# Patient Record
Sex: Male | Born: 1935 | Race: Black or African American | Hispanic: No | Marital: Married | Smoking: Former smoker
Health system: Southern US, Community
[De-identification: ages and names within clinical notes are randomized; demographics above are authoritative.]

## PROBLEM LIST (undated history)

## (undated) DIAGNOSIS — G4700 Insomnia, unspecified: Secondary | ICD-10-CM

## (undated) DIAGNOSIS — F0280 Dementia in other diseases classified elsewhere without behavioral disturbance: Secondary | ICD-10-CM

## (undated) DIAGNOSIS — D497 Neoplasm of unspecified behavior of endocrine glands and other parts of nervous system: Secondary | ICD-10-CM

## (undated) DIAGNOSIS — L989 Disorder of the skin and subcutaneous tissue, unspecified: Secondary | ICD-10-CM

## (undated) DIAGNOSIS — H9190 Unspecified hearing loss, unspecified ear: Secondary | ICD-10-CM

## (undated) DIAGNOSIS — E278 Other specified disorders of adrenal gland: Secondary | ICD-10-CM

## (undated) DIAGNOSIS — Z973 Presence of spectacles and contact lenses: Secondary | ICD-10-CM

## (undated) DIAGNOSIS — D649 Anemia, unspecified: Secondary | ICD-10-CM

## (undated) DIAGNOSIS — F419 Anxiety disorder, unspecified: Secondary | ICD-10-CM

## (undated) DIAGNOSIS — Z972 Presence of dental prosthetic device (complete) (partial): Secondary | ICD-10-CM

## (undated) HISTORY — PX: TRANSURETHRAL RESECTION OF PROSTATE: SHX73

---

## 1996-02-20 DIAGNOSIS — N400 Enlarged prostate without lower urinary tract symptoms: Secondary | ICD-10-CM

## 1996-02-20 DIAGNOSIS — C61 Malignant neoplasm of prostate: Secondary | ICD-10-CM

## 1996-02-20 HISTORY — DX: Benign prostatic hyperplasia without lower urinary tract symptoms: N40.0

## 1996-02-20 HISTORY — DX: Malignant neoplasm of prostate: C61

## 2004-06-26 ENCOUNTER — Emergency Department (HOSPITAL_COMMUNITY): Admission: EM | Admit: 2004-06-26 | Discharge: 2004-06-26 | Payer: Self-pay | Admitting: Emergency Medicine

## 2004-07-05 ENCOUNTER — Inpatient Hospital Stay (HOSPITAL_COMMUNITY): Admission: RE | Admit: 2004-07-05 | Discharge: 2004-07-07 | Payer: Self-pay | Admitting: Urology

## 2010-04-23 ENCOUNTER — Inpatient Hospital Stay (HOSPITAL_COMMUNITY)
Admission: EM | Admit: 2010-04-23 | Discharge: 2010-04-26 | DRG: 726 | Disposition: A | Discharge status: Home / Self Care | Payer: Medicare Other | Attending: Urology | Admitting: Urology

## 2010-04-23 DIAGNOSIS — N138 Other obstructive and reflux uropathy: Principal | ICD-10-CM | POA: Diagnosis present

## 2010-04-23 DIAGNOSIS — R310 Gross hematuria: Secondary | ICD-10-CM | POA: Diagnosis present

## 2010-04-23 DIAGNOSIS — N390 Urinary tract infection, site not specified: Secondary | ICD-10-CM | POA: Diagnosis present

## 2010-04-23 DIAGNOSIS — N3289 Other specified disorders of bladder: Secondary | ICD-10-CM | POA: Diagnosis present

## 2010-04-23 DIAGNOSIS — N401 Enlarged prostate with lower urinary tract symptoms: Principal | ICD-10-CM | POA: Diagnosis present

## 2010-04-23 DIAGNOSIS — N400 Enlarged prostate without lower urinary tract symptoms: Secondary | ICD-10-CM | POA: Diagnosis present

## 2010-04-23 DIAGNOSIS — R339 Retention of urine, unspecified: Secondary | ICD-10-CM | POA: Diagnosis present

## 2010-04-23 LAB — CBC
HCT: 40.8 % (ref 39.0–52.0)
Hemoglobin: 13.3 g/dL (ref 13.0–17.0)
MCH: 29.8 pg (ref 26.0–34.0)
MCHC: 32.6 g/dL (ref 30.0–36.0)
RBC: 4.46 MIL/uL (ref 4.22–5.81)
RDW: 13.3 % (ref 11.5–15.5)

## 2010-04-23 LAB — BASIC METABOLIC PANEL
CO2: 27 mEq/L (ref 19–32)
Calcium: 9.3 mg/dL (ref 8.4–10.5)
Creatinine, Ser: 0.87 mg/dL (ref 0.4–1.5)
Glucose, Bld: 104 mg/dL — ABNORMAL HIGH (ref 70–99)
Potassium: 3.8 mEq/L (ref 3.5–5.1)

## 2010-04-23 LAB — URINALYSIS, ROUTINE W REFLEX MICROSCOPIC
Glucose, UA: NEGATIVE mg/dL
Nitrite: POSITIVE — AB
Specific Gravity, Urine: 1.024 (ref 1.005–1.030)
Urobilinogen, UA: 1 mg/dL (ref 0.0–1.0)
pH: 5 (ref 5.0–8.0)

## 2010-04-23 LAB — URINE MICROSCOPIC-ADD ON

## 2010-04-23 LAB — DIFFERENTIAL
Lymphs Abs: 1.9 10*3/uL (ref 0.7–4.0)
Monocytes Absolute: 0.4 10*3/uL (ref 0.1–1.0)

## 2010-04-25 ENCOUNTER — Inpatient Hospital Stay (HOSPITAL_COMMUNITY): Payer: Medicare Other

## 2010-04-25 LAB — URINE CULTURE: Culture  Setup Time: 201203042347

## 2010-04-25 LAB — HEMOGLOBIN AND HEMATOCRIT, BLOOD: HCT: 37.5 % — ABNORMAL LOW (ref 39.0–52.0)

## 2010-04-25 MED ORDER — IOHEXOL 300 MG/ML  SOLN
125.0000 mL | Freq: Once | INTRAMUSCULAR | Status: AC | PRN
  Administered 2010-04-25: 125 mL via INTRAVENOUS

## 2010-04-28 NOTE — Discharge Summary (Signed)
Adrian Valentine, Adrian Valentine                   ACCOUNT NO.:  [id number]  MEDICAL RECORD NO.:  [id number]           PATIENT TYPE:  I  LOCATION:  1305                         FACILITY:  [facility]  PHYSICIAN:  Excell Seltzer. Annabell Howells, M.D.    DATE OF BIRTH:  01/15/1934  DATE OF ADMISSION:  04/23/2010 DATE OF DISCHARGE:                              DISCHARGE SUMMARY   BRIEF HISTORY:  Briefly, [name] is a 77 year old African-American male with 3 prior TURP, who had the sudden onset of gross hematuria on March 04 with difficulty emptying his bladder.  He was seen in the ER and placed on small catheters, did not successfully correct the problem. The patient had no irritative voiding symptoms or significant voiding difficulty prior to the onset of hematuria.  For additional details of history and physical, please see the H&P.  MEDICAL HISTORY:  Significant for BPH and bladder outlet obstruction for 3 prior TURP.  MEDICATIONS:  His only medications were multivitamin, vitamin C and cod liver oil but he did take some NyQuil in the days preceding of it.  ALLERGIES:  He has no drug allergies.  LABORATORY DATA:  Accessory clinical information, admission urine had large bilirubin, large blood, greater than 300 mg/dL protein was nitrite positive, large leukocyte esterase.  Urine micro had 3 to 6 white cells and too numerous to count red cells and few bacteria.  Urine culture was negative.  Initial CBC had a hemoglobin of 13.3.  White count of 9. BMET was normal with exception of a glucose of 104 on a nonfasting lab. Repeat H&H on March 6 had a hemoglobin of 12.  CT scan of the abdomen and pelvis with and without contrast on April 25, 2010 showed a large homogeneous prostate, bilateral benign appearing adrenal adenoma, and no other significant abnormalities.  HOSPITAL COURSE:  On the day of admission, a large 3-way Foley catheter was placed and the bladder was irrigated.  He was started on Bactrim DS 1 p.o. b.i.d.,  pending urine culture.  After admission, the bleeding gradually cleared but on March 5, he was having leaking around the catheter and was found to have a large amount of residual clots that require irrigation out by me.  Once the irrigation had been completed, it was felt to not be actively bleeding.  I went ahead and got the CT to rule out an upper tract cause of his bleeding.  This was noted above and was unremarkable with exception of a large prostate.  His Foley catheter was removed on the morning of March 6 prior to the CT scan but he was unable to void throughout the day and bladder scan revealed residual of over 500 mL.  He did void a small amount but Foley catheter was placed with significant return, so it was left to straight drainage.  On March 7, he was doing well.  His urine was clear.  His hemoglobin was 12.  He was felt to be ready for discharge home.  FINAL DIAGNOSES:  Gross hematuria secondary to benign prostatic hypertrophy with clot retention.  The bleeding resolved but the retention  did not.  There were no complications.  DISCHARGE INSTRUCTIONS:  He will be discharged home on tamsulosin and finasteride with instructions to avoid nonsteroidal anti-inflammatories or aspirin and decongestants.  He will follow with me on March 16 in my Regional Office for cystoscopy and assessment for additional therapy. He may need a repeat TURP or possibly an open prostatectomy.  DISPOSITION:  Home.  PROGNOSIS:  Good.  CONDITION ON DISCHARGE:  Improved.     Excell Seltzer. Annabell Howells, M.D.     JJW/MEDQ  D:  04/26/2010  T:  04/26/2010  Job:  [id number]  Electronically Signed by Bjorn Pippin M.D. on 04/27/2010 12:58:51 PM

## 2010-05-05 ENCOUNTER — Ambulatory Visit (INDEPENDENT_AMBULATORY_CARE_PROVIDER_SITE_OTHER): Payer: Medicare Other | Admitting: Urology

## 2010-05-05 DIAGNOSIS — N401 Enlarged prostate with lower urinary tract symptoms: Secondary | ICD-10-CM

## 2010-05-05 DIAGNOSIS — R338 Other retention of urine: Secondary | ICD-10-CM

## 2011-11-01 IMAGING — CT CT ABD-PEL WO/W CM
2 of 10 series · 10 of 46 positions shown, 16 images · IV contrast (agent unspecified)
Comparison: None.

CLINICAL DATA: Gross hematuria.  Urinary retention.

CT ABDOMEN AND PELVIS WITHOUT AND WITH CONTRAST
TECHNIQUE: Multidetector CT imaging of the abdomen and pelvis was
performed without contrast material in one or both body regions,
followed by contrast material(s) and further sections in one or
both body regions.
Contrast: 125 ml Fmnipaque-XAA

[Series 8: post · axial · 0.74mm/px · z∈[-494,-154]mm · 8 of 88 slices shown, 13 images]
[im 10/88  soft-tissue]
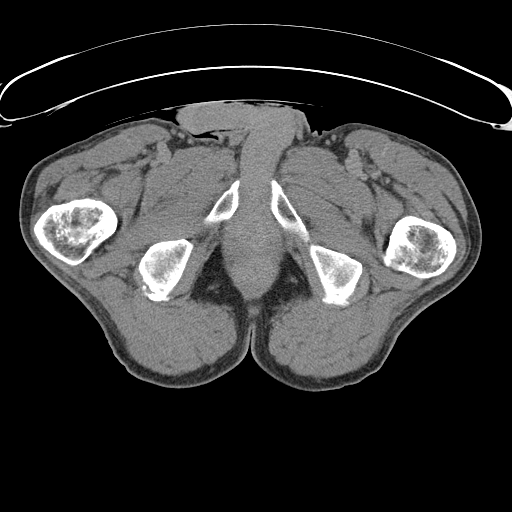
[im 10/88  bone]
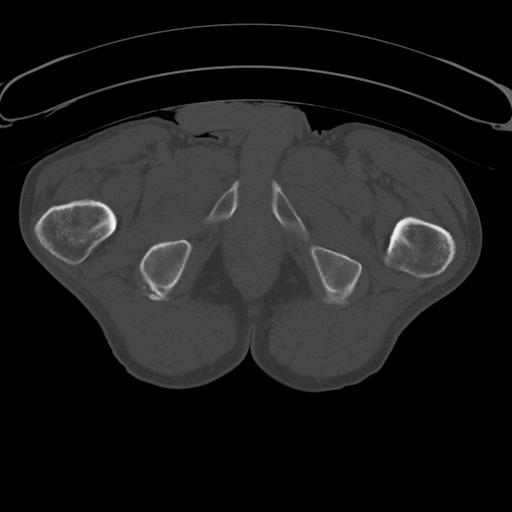
[im 20/88  soft-tissue]
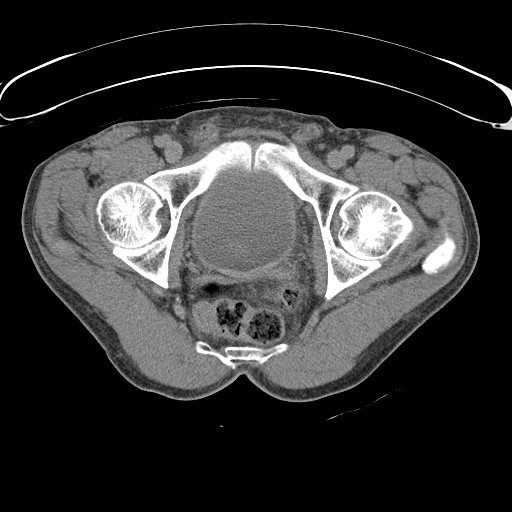
[im 30/88  soft-tissue]
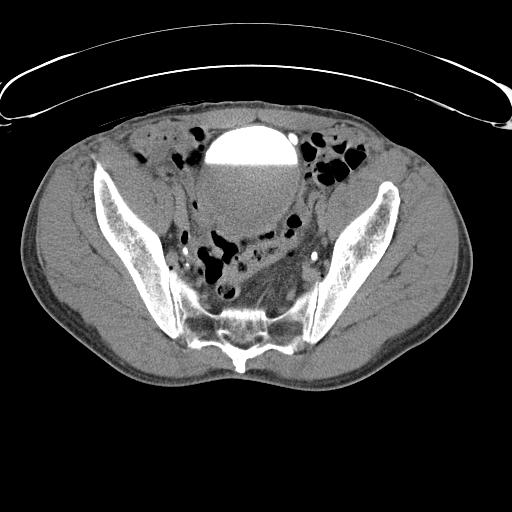
[im 39/88  soft-tissue]
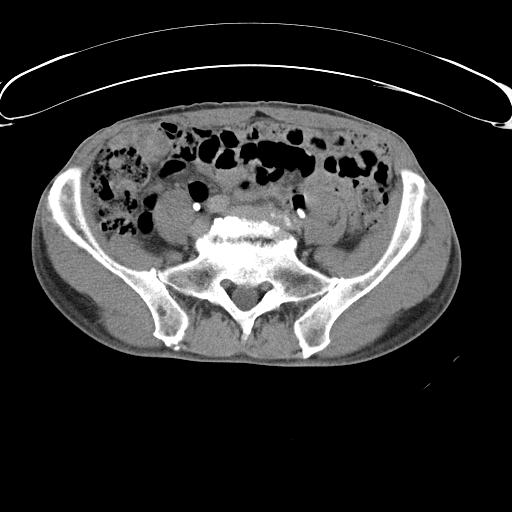
[im 49/88  soft-tissue]
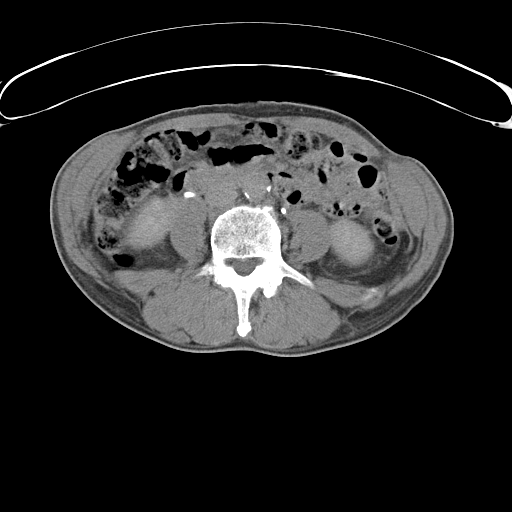
[im 49/88  lung]
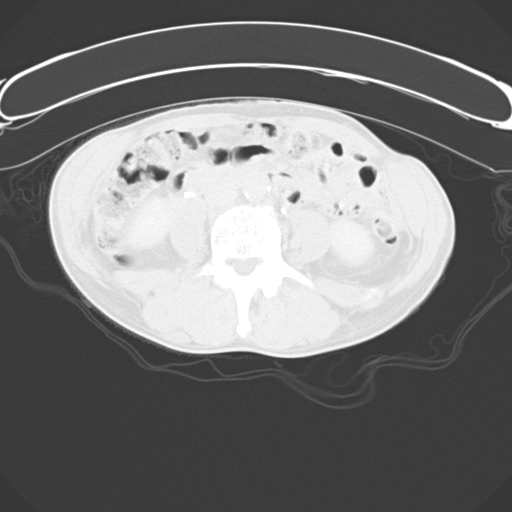
[im 59/88  soft-tissue]
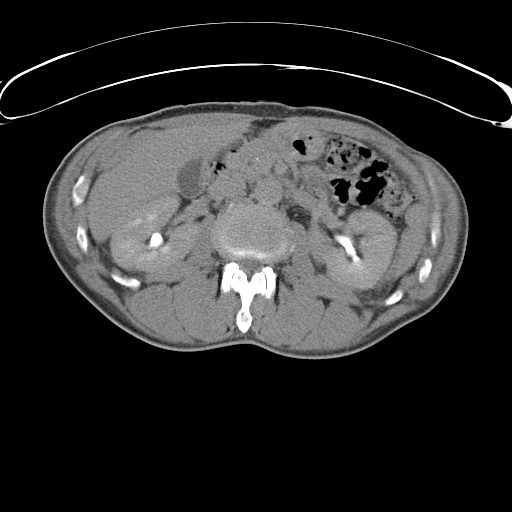
[im 59/88  lung]
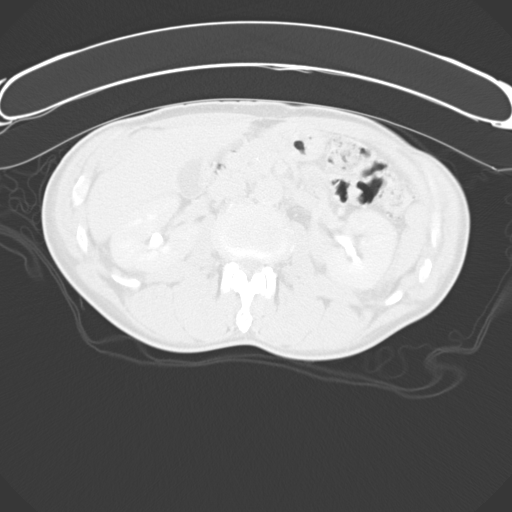
[im 68/88  soft-tissue]
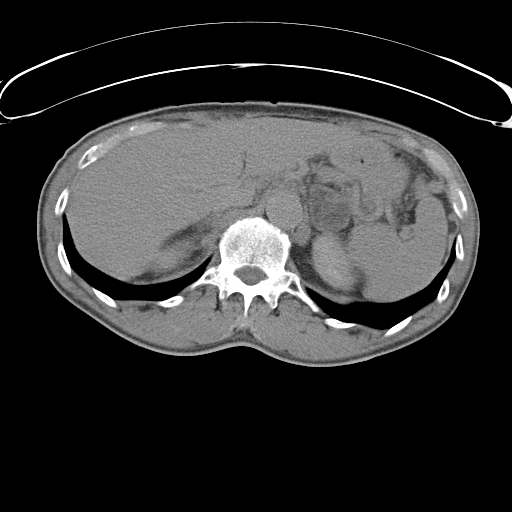
[im 68/88  lung]
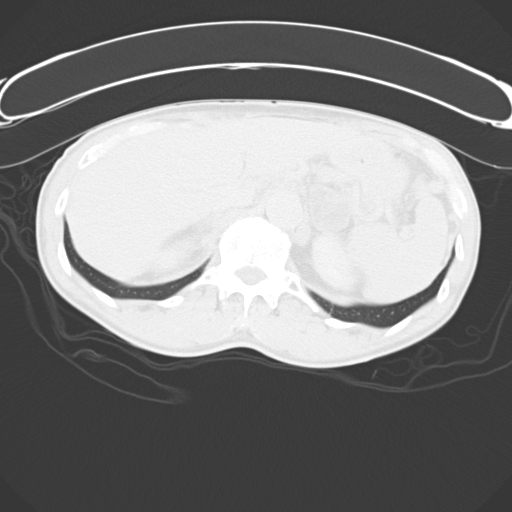
[im 78/88  soft-tissue]
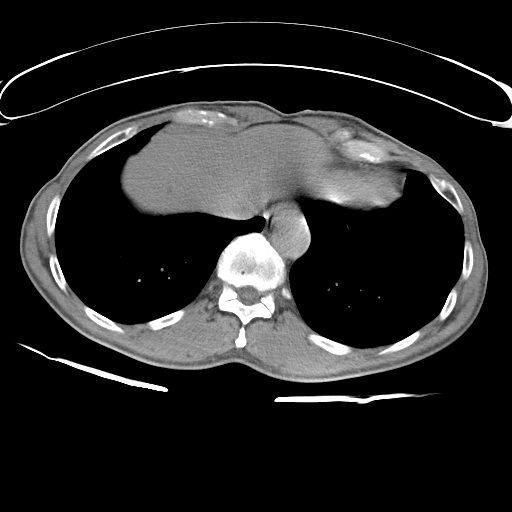
[im 78/88  lung]
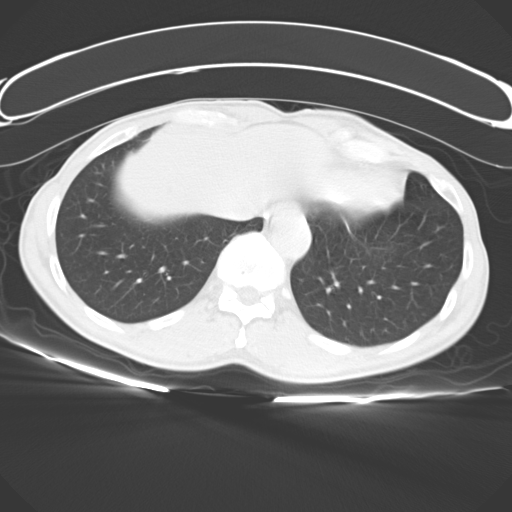

[Series 602: coronal wo · coronal · 0.80mm/px · 2 of 80 slices shown, 3 images]
[im 27/80  soft-tissue]
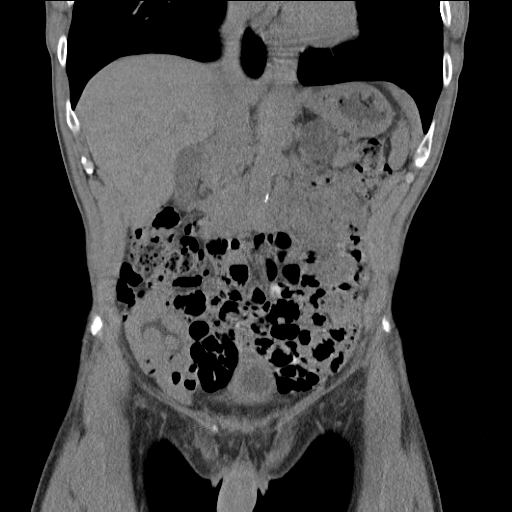
[im 27/80  bone]
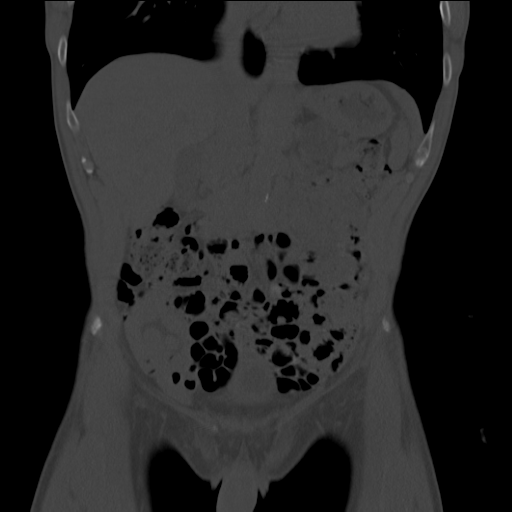
[im 53/80  soft-tissue]
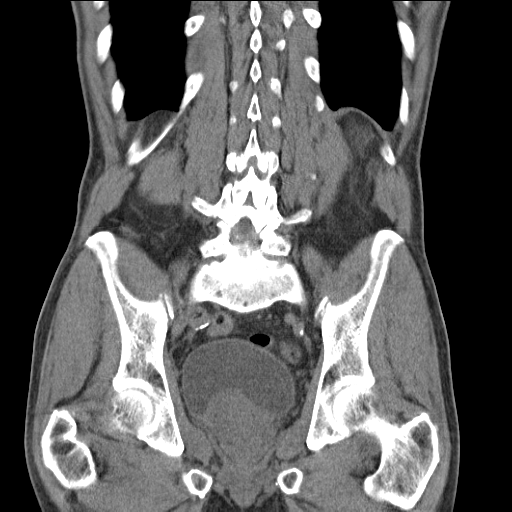

[10 of 46 positions shown; findings below may reference images not displayed]

FINDINGS: Precontrast images show no evidence of renal calculi or
hydronephrosis.  There is no evidence of ureteral calculi or
dilatation.  No calculi are seen within the urinary bladder.

Postcontrast images show no evidence of renal masses or other
parenchymal lesions.  Delayed excretory phase images show normal
appearance of the both renal collecting systems and ureters.

The bladder is distended and shows mild wall thickening with a
small diverticulum along the left anterior bladder wall.  This is
consistent with chronic bladder outlet obstruction.  Markedly
enlarged and heterogeneous prostate gland is seen with median lobe
enlargement indenting the base of the urinary bladder.  The seminal
vesicles are symmetric.  No other pelvic masses or lymphadenopathy
identified.

The liver contains several small cysts, but no liver masses are
identified.  Gallbladder is unremarkable.  The spleen and pancreas
are normal in appearance.

Adrenal soft tissue masses are seen bilaterally, which have low
attenuation, measuring - 10 HU on the left and 4 HU on the right.
These are consistent with benign adrenal adenomas.  No other
abdominal masses or lymphadenopathy identified.  No evidence of
inflammatory process or abnormal fluid collections.  No evidence of
dilated bowel loops.
IMPRESSION: 1.  Markedly enlarged heterogeneous prostate gland with mass effect
on the bladder base.  Findings of chronic bladder outlet
obstruction also noted.
2.  No evidence of renal neoplasm, urolithiasis, or hydronephrosis.
3.  Bilateral benign adrenal adenomas and small hepatic cysts
incidentally noted.

## 2016-03-02 ENCOUNTER — Telehealth: Payer: Self-pay | Admitting: Family Medicine

## 2016-11-19 NOTE — Telephone Encounter (Signed)
Issue resolved.

## 2017-02-19 DIAGNOSIS — D497 Neoplasm of unspecified behavior of endocrine glands and other parts of nervous system: Secondary | ICD-10-CM

## 2017-02-19 HISTORY — DX: Neoplasm of unspecified behavior of endocrine glands and other parts of nervous system: D49.7

## 2019-10-21 DIAGNOSIS — C9110 Chronic lymphocytic leukemia of B-cell type not having achieved remission: Secondary | ICD-10-CM

## 2019-10-21 HISTORY — DX: Chronic lymphocytic leukemia of B-cell type not having achieved remission: C91.10

## 2020-11-19 DIAGNOSIS — R972 Elevated prostate specific antigen [PSA]: Secondary | ICD-10-CM

## 2020-11-19 HISTORY — DX: Elevated prostate specific antigen (PSA): R97.20

## 2020-12-19 ENCOUNTER — Other Ambulatory Visit (HOSPITAL_COMMUNITY): Payer: Self-pay | Admitting: Urology

## 2020-12-19 ENCOUNTER — Other Ambulatory Visit: Payer: Self-pay | Admitting: Urology

## 2020-12-19 DIAGNOSIS — N402 Nodular prostate without lower urinary tract symptoms: Secondary | ICD-10-CM

## 2020-12-19 DIAGNOSIS — R972 Elevated prostate specific antigen [PSA]: Secondary | ICD-10-CM

## 2020-12-27 ENCOUNTER — Encounter (HOSPITAL_BASED_OUTPATIENT_CLINIC_OR_DEPARTMENT_OTHER): Payer: Self-pay | Admitting: Urology

## 2020-12-28 ENCOUNTER — Encounter (HOSPITAL_BASED_OUTPATIENT_CLINIC_OR_DEPARTMENT_OTHER): Payer: Self-pay | Admitting: Urology

## 2020-12-28 ENCOUNTER — Other Ambulatory Visit: Payer: Self-pay

## 2020-12-28 DIAGNOSIS — G309 Alzheimer's disease, unspecified: Secondary | ICD-10-CM

## 2020-12-28 DIAGNOSIS — F0280 Dementia in other diseases classified elsewhere without behavioral disturbance: Secondary | ICD-10-CM

## 2020-12-28 HISTORY — DX: Alzheimer's disease, unspecified: G30.9

## 2020-12-28 HISTORY — DX: Dementia in other diseases classified elsewhere, unspecified severity, without behavioral disturbance, psychotic disturbance, mood disturbance, and anxiety: F02.80

## 2020-12-28 NOTE — Progress Notes (Signed)
Spoke w/ via phone for pre-op interview---pt daughter Adrian Valentine due to pt with alzhimer's and hoh Lab needs dos----     cbc I stat (gent ordered)          Lab results------see be daughter shanastates asymptomatic no test needed Arrive at -------1100 am 12-30-2020 NPO after MN NO Solid Food.  Clear liquids from MN until---1000 am Med rec completed Medications to take morning of surgery -----none Diabetic medication -----n/a Patient instructed no nail polish to be worn day of surgery Patient instructed to bring photo id and insurance card day of surgery Patient aware to have Driver (ride ) / caregiver    for 24 hours after surgery  daughter Adrian Valentine will stay Patient Special Instructions -----fleets enema am of sugrery Pre-Op special Istructions -----none Patient verbalized understanding of instructions that were given at this phone interview. Patient denies shortness of breath, chest pain, fever, cough at this phone interview.   [facility] oncology 10-21-2020 dr a stefanovic epic Lov pulmonary dr c radojicic epic Neurology lov michelle sanflippo pa 10-13-2018 epic   Pt daughter Adrian Valentine helps with medications and permit  due to alzhimer's disease and hoh, pt signs own consent per daughter Adrian Valentine, no formal poa. Pt is able to walk and transfer per daughter Adrian Valentine

## 2020-12-30 ENCOUNTER — Ambulatory Visit (HOSPITAL_BASED_OUTPATIENT_CLINIC_OR_DEPARTMENT_OTHER)
Admission: RE | Admit: 2020-12-30 | Discharge: 2020-12-30 | Disposition: A | Discharge status: Home / Self Care | Payer: Medicare Other | Attending: Urology | Admitting: Urology

## 2020-12-30 ENCOUNTER — Encounter (HOSPITAL_BASED_OUTPATIENT_CLINIC_OR_DEPARTMENT_OTHER)
Admission: RE | Disposition: A | Discharge status: Home / Self Care | Payer: Self-pay | Source: Home / Self Care | Attending: Urology

## 2020-12-30 ENCOUNTER — Ambulatory Visit (HOSPITAL_BASED_OUTPATIENT_CLINIC_OR_DEPARTMENT_OTHER): Payer: Medicare Other | Admitting: Anesthesiology

## 2020-12-30 ENCOUNTER — Encounter (HOSPITAL_BASED_OUTPATIENT_CLINIC_OR_DEPARTMENT_OTHER): Payer: Self-pay | Admitting: Urology

## 2020-12-30 ENCOUNTER — Ambulatory Visit (HOSPITAL_COMMUNITY)
Admission: RE | Admit: 2020-12-30 | Discharge: 2020-12-30 | Disposition: A | Discharge status: Home / Self Care | Payer: Medicare Other | Source: Ambulatory Visit | Attending: Urology | Admitting: Urology

## 2020-12-30 ENCOUNTER — Other Ambulatory Visit: Payer: Self-pay

## 2020-12-30 DIAGNOSIS — R351 Nocturia: Secondary | ICD-10-CM | POA: Insufficient documentation

## 2020-12-30 DIAGNOSIS — N401 Enlarged prostate with lower urinary tract symptoms: Secondary | ICD-10-CM | POA: Diagnosis present

## 2020-12-30 DIAGNOSIS — R972 Elevated prostate specific antigen [PSA]: Secondary | ICD-10-CM

## 2020-12-30 DIAGNOSIS — F0390 Unspecified dementia without behavioral disturbance: Secondary | ICD-10-CM | POA: Diagnosis not present

## 2020-12-30 DIAGNOSIS — E278 Other specified disorders of adrenal gland: Secondary | ICD-10-CM | POA: Diagnosis not present

## 2020-12-30 DIAGNOSIS — C61 Malignant neoplasm of prostate: Secondary | ICD-10-CM | POA: Insufficient documentation

## 2020-12-30 DIAGNOSIS — N402 Nodular prostate without lower urinary tract symptoms: Secondary | ICD-10-CM | POA: Diagnosis not present

## 2020-12-30 DIAGNOSIS — Z79899 Other long term (current) drug therapy: Secondary | ICD-10-CM | POA: Insufficient documentation

## 2020-12-30 DIAGNOSIS — D696 Thrombocytopenia, unspecified: Secondary | ICD-10-CM | POA: Insufficient documentation

## 2020-12-30 DIAGNOSIS — Z87891 Personal history of nicotine dependence: Secondary | ICD-10-CM | POA: Diagnosis not present

## 2020-12-30 DIAGNOSIS — C9110 Chronic lymphocytic leukemia of B-cell type not having achieved remission: Secondary | ICD-10-CM | POA: Insufficient documentation

## 2020-12-30 HISTORY — DX: Neoplasm of unspecified behavior of endocrine glands and other parts of nervous system: D49.7

## 2020-12-30 HISTORY — DX: Dementia in other diseases classified elsewhere, unspecified severity, without behavioral disturbance, psychotic disturbance, mood disturbance, and anxiety: F02.80

## 2020-12-30 HISTORY — DX: Anemia, unspecified: D64.9

## 2020-12-30 HISTORY — DX: Disorder of the skin and subcutaneous tissue, unspecified: L98.9

## 2020-12-30 HISTORY — DX: Presence of spectacles and contact lenses: Z97.3

## 2020-12-30 HISTORY — DX: Anxiety disorder, unspecified: F41.9

## 2020-12-30 HISTORY — DX: Presence of dental prosthetic device (complete) (partial): Z97.2

## 2020-12-30 HISTORY — DX: Unspecified hearing loss, unspecified ear: H91.90

## 2020-12-30 HISTORY — DX: Other specified disorders of adrenal gland: E27.8

## 2020-12-30 HISTORY — PX: PROSTATE BIOPSY: SHX241

## 2020-12-30 HISTORY — DX: Insomnia, unspecified: G47.00

## 2020-12-30 LAB — POCT I-STAT, CHEM 8
BUN: 21 mg/dL (ref 8–23)
Calcium, Ion: 1.22 mmol/L (ref 1.15–1.40)
Chloride: 107 mmol/L (ref 98–111)
Creatinine, Ser: 1 mg/dL (ref 0.61–1.24)
Glucose, Bld: 87 mg/dL (ref 70–99)
HCT: 33 % — ABNORMAL LOW (ref 39.0–52.0)
Hemoglobin: 11.2 g/dL — ABNORMAL LOW (ref 13.0–17.0)
Potassium: 4 mmol/L (ref 3.5–5.1)
Sodium: 141 mmol/L (ref 135–145)
TCO2: 24 mmol/L (ref 22–32)

## 2020-12-30 SURGERY — BIOPSY, PROSTATE, RECTAL APPROACH, WITH US GUIDANCE
Anesthesia: Monitor Anesthesia Care | Site: Prostate

## 2020-12-30 MED ORDER — FENTANYL CITRATE (PF) 250 MCG/5ML IJ SOLN
INTRAMUSCULAR | Status: DC | PRN
  Administered 2020-12-30: 50 ug via INTRAVENOUS

## 2020-12-30 MED ORDER — GENTAMICIN SULFATE 40 MG/ML IJ SOLN
5.0000 mg/kg | INTRAVENOUS | Status: AC
  Administered 2020-12-30: 320 mg via INTRAVENOUS
  Filled 2020-12-30: qty 8

## 2020-12-30 MED ORDER — PROPOFOL 10 MG/ML IV BOLUS
INTRAVENOUS | Status: AC
  Filled 2020-12-30: qty 20

## 2020-12-30 MED ORDER — ACETAMINOPHEN 10 MG/ML IV SOLN: 1000.0000 mg | Freq: Once | INTRAVENOUS | Status: DC | PRN

## 2020-12-30 MED ORDER — SODIUM CHLORIDE 0.9 % IV SOLN
INTRAVENOUS | Status: AC
  Filled 2020-12-30: qty 100

## 2020-12-30 MED ORDER — PROPOFOL 500 MG/50ML IV EMUL
INTRAVENOUS | Status: AC
  Filled 2020-12-30: qty 50

## 2020-12-30 MED ORDER — ONDANSETRON HCL 4 MG/2ML IJ SOLN
INTRAMUSCULAR | Status: DC | PRN
  Administered 2020-12-30: 4 mg via INTRAVENOUS

## 2020-12-30 MED ORDER — PROPOFOL 500 MG/50ML IV EMUL
INTRAVENOUS | Status: DC | PRN
  Administered 2020-12-30: 100 ug/kg/min via INTRAVENOUS

## 2020-12-30 MED ORDER — SODIUM CHLORIDE 0.9 % IV SOLN: INTRAVENOUS | Status: DC

## 2020-12-30 MED ORDER — SODIUM CHLORIDE 0.9% FLUSH: 3.0000 mL | Freq: Two times a day (BID) | INTRAVENOUS | Status: DC

## 2020-12-30 MED ORDER — PROPOFOL 10 MG/ML IV BOLUS
INTRAVENOUS | Status: DC | PRN
  Administered 2020-12-30: 20 mg via INTRAVENOUS
  Administered 2020-12-30: 10 mg via INTRAVENOUS

## 2020-12-30 MED ORDER — SODIUM CHLORIDE 0.9 % IV SOLN
2.0000 g | INTRAVENOUS | Status: AC
  Administered 2020-12-30: 2 g via INTRAVENOUS

## 2020-12-30 MED ORDER — ONDANSETRON HCL 4 MG/2ML IJ SOLN: 4.0000 mg | Freq: Once | INTRAMUSCULAR | Status: DC | PRN

## 2020-12-30 MED ORDER — FLEET ENEMA 7-19 GM/118ML RE ENEM: 1.0000 | ENEMA | Freq: Once | RECTAL | Status: DC

## 2020-12-30 MED ORDER — EPHEDRINE SULFATE-NACL 50-0.9 MG/10ML-% IV SOSY
PREFILLED_SYRINGE | INTRAVENOUS | Status: DC | PRN
  Administered 2020-12-30: 10 mg via INTRAVENOUS

## 2020-12-30 MED ORDER — FENTANYL CITRATE (PF) 100 MCG/2ML IJ SOLN
INTRAMUSCULAR | Status: AC
  Filled 2020-12-30: qty 2

## 2020-12-30 MED ORDER — CEFTRIAXONE SODIUM 2 G IJ SOLR
INTRAMUSCULAR | Status: AC
  Filled 2020-12-30: qty 20

## 2020-12-30 MED ORDER — PHENYLEPHRINE 40 MCG/ML (10ML) SYRINGE FOR IV PUSH (FOR BLOOD PRESSURE SUPPORT)
PREFILLED_SYRINGE | INTRAVENOUS | Status: DC | PRN
  Administered 2020-12-30 (×4): 120 ug via INTRAVENOUS

## 2020-12-30 SURGICAL SUPPLY — 12 items
GLOVE SURG POLYISO LF SZ8 (GLOVE) IMPLANT
INST BIOPSY MAXCORE 18GX25 (NEEDLE) IMPLANT
INSTR BIOPSY MAXCORE 18GX20 (NEEDLE) ×1 IMPLANT
KIT TURNOVER CYSTO (KITS) ×2 IMPLANT
NDL SAFETY ECLIPSE 18X1.5 (NEEDLE) IMPLANT
NDL SPNL 22GX7 QUINCKE BK (NEEDLE) IMPLANT
NEEDLE HYPO 18GX1.5 SHARP (NEEDLE)
NEEDLE SPNL 22GX7 QUINCKE BK (NEEDLE) IMPLANT
SURGILUBE 2OZ TUBE FLIPTOP (MISCELLANEOUS) ×2 IMPLANT
SYR CONTROL 10ML LL (SYRINGE) IMPLANT
TOWEL OR 17X26 10 PK STRL BLUE (TOWEL DISPOSABLE) ×2 IMPLANT
UNDERPAD 30X36 HEAVY ABSORB (UNDERPADS AND DIAPERS) ×2 IMPLANT

## 2020-12-30 NOTE — Interval H&P Note (Signed)
History and Physical Interval Note:  12/30/2020 12:37 PM  Adrian Valentine  has presented today for surgery, with the diagnosis of [place] PSA.  The various methods of treatment have been discussed with the patient and family. After consideration of risks, benefits and other options for treatment, the patient has consented to  Procedure(s): BIOPSY TRANSRECTAL ULTRASONIC PROSTATE (TUBP) (N/A) as a surgical intervention.  The patient's history has been reviewed, patient examined, no change in status, stable for surgery.  I have reviewed the patient's chart and labs.  Questions were answered to the patient's satisfaction.     Irine Seal

## 2020-12-30 NOTE — Discharge Instructions (Signed)

## 2020-12-30 NOTE — Anesthesia Preprocedure Evaluation (Signed)
Anesthesia Evaluation  Patient identified by MRN, date of birth, ID band Patient awake    Reviewed: Allergy & Precautions, NPO status , Patient's Chart, lab work & pertinent test results  Airway Mallampati: II  TM Distance: >3 FB Neck ROM: Full    Dental  (+) Upper Dentures   Pulmonary neg pulmonary ROS, former smoker,    Pulmonary exam normal breath sounds clear to auscultation       Cardiovascular negative cardio ROS Normal cardiovascular exam Rhythm:Regular Rate:Normal     Neuro/Psych Dementia negative neurological ROS     GI/Hepatic negative GI ROS, Neg liver ROS,   Endo/Other  negative endocrine ROS  Renal/GU negative Renal ROS  negative genitourinary   Musculoskeletal negative musculoskeletal ROS (+)   Abdominal   Peds negative pediatric ROS (+)  Hematology  (+) Blood dyscrasia, , CLL   Anesthesia Other Findings   Reproductive/Obstetrics negative OB ROS                             Anesthesia Physical Anesthesia Plan  ASA: 3  Anesthesia Plan: MAC   Post-op Pain Management:    Induction: Intravenous  PONV Risk Score and Plan: 1 and Propofol infusion and Treatment may vary due to age or medical condition  Airway Management Planned: Simple Face Mask  Additional Equipment:   Intra-op Plan:   Post-operative Plan:   Informed Consent: I have reviewed the patients History and Physical, chart, labs and discussed the procedure including the risks, benefits and alternatives for the proposed anesthesia with the patient or authorized representative who has indicated his/her understanding and acceptance.     Dental advisory given  Plan Discussed with: CRNA and Surgeon  Anesthesia Plan Comments:         Anesthesia Quick Evaluation

## 2020-12-30 NOTE — Op Note (Signed)
Procedure: 1.  Transrectal ultrasound of the prostate. 2.  Ultrasound-guided needle biopsy of the prostate.  Preop diagnosis: Elevated PSA.  Postop diagnosis: Same.  Surgeon: Dr. Irine Seal.  Anesthesia: MAC.  Specimen: 12 core needle prostate biopsy specimens.  Drain: None.  EBL: None.  Complications: None.  Indications: Adrian Valentine is an 87 year old African-American male who has a long history of BPH and bladder outlet obstruction has been managed by TURP x2 and is currently managed with finasteride.  He has had a rising PSA on finasteride and a left base prostate nodule but because of his age and multiple comorbidities including CLL and dementia a biopsy had been declined; however his PSA is jumped from 93 to 30 and I recommended a biopsy.  Procedure: He was given gentamicin and Rocephin.  He was taken operating room where he was left on the holding room stretcher but placed in the left lateral decubitus position with the buttock at the edge of the table.  He was given sedation as needed.  The 10 MHz ultrasound probe was assembled and inserted and scanning was performed.  The seminal vesicles were normal.  The prostate had a somewhat hypoechoic peripheral zone that was slightly thicker on the left and the right.  On the right there was a large mass arising from the transitional zone extending into the bladder with some irregularity.  The left transitional zone was more unremarkable.  No calcifications were noted.  The prostate volume including the intravesical component was 111 mL.  Once the diagnostic scan was performed a 12 core template needle biopsy was performed and tissue from the transitional zone on the right was obtained as well with the standard biopsies.  The probe was removed and there was minimal evidence of bleeding.  He was then rolled supine and returned to the PACU in stable condition.  There were no complications.

## 2020-12-30 NOTE — H&P (Signed)
BPH  HPI: Adrian Valentine is a 87 year-old male established patient who is here for follow up regarding further evaluation of BPH and lower urinary tract symptoms.  The patient complains of lower urinary tract symptom(s) that include straining and nocturia. The patient states his most bothersome symptom(s) are the following: nocturia. Patient is currently treated with Finasteride for his symptoms. His symptoms have been stable over the last year.   Adrian Valentine returns today in f/u for his history of a rising PSA on finasteride. It was up to 8.2 in 1/20 and then 12.5 prior to his visit in 7/20 and 12.2 in 4/22. It is now 36 on 12/01/20. He remains on finasteride and is voiding ok with some urgency and nocturia x 2. He does wear a pad. He has no weight loss or bone pain. He has gained 6-9lbs. He has CLL and is followed at [facility] and is having some worsening of disease but not to the point of needing therapy.   He was seen at [facility] for a leukocytosis and a CT was done that showed a 3.8cm left adrenal mass with concern for possible adrenocortical CA but on review of prior CT's, this lesion has been stable since 2006.   GU Hx: Prior history of several TURPs with recurrent gross hematuria. He has been managed for the past several years on finasteride taken daily. He has an elelvated PSA that was up to 7.4 off of the finasteride but fell to 5.17 on resumption. It is up to 6.9 from 6.62 at his last visit 6 months ago. The patient notes no changes in his voiding symptoms. Continues with nocturia 1-3 times per night. His IPSS is 10. There has been no interval hematuria. He denies dysuria. His UA is clear.     AUA Symptom Score: He never has the sensation of not emptying his bladder completely after finishing urinating. Less than 20% of the time he has to urinate again fewer than two hours after he has finished urinating. He does not have to stop and start again several times when he urinates. 50% of the time he finds it  difficult to postpone urination. He never has a weak urinary stream. He never has to push or strain to begin urination. He has to get up to urinate 2 times from the time he goes to bed until the time he gets up in the morning.   Calculated AUA Symptom Score: 6    ALLERGIES: No Allergies    MEDICATIONS: Finasteride 5 mg tablet 1 tablet PO Daily  Zyrtec  Aricept 5 mg tablet  Multi-Vitamin TABS Oral  Vitamin C 500 mg tablet Oral  Vitamin D3     GU PSH: Cystoscopy TURP - 2011       PSH Notes: Transurethral Resection Of Prostate (TURP)   NON-GU PSH: No Non-GU PSH    GU PMH: BPH w/LUTS, He has stable LUTS on finasteride but is due for a PSA. I discussed not doing it but since it is rising on finasteride, I think we need to recheck it because a rise on finasteride can be associated with a high risk prostate cancer. I will have him return in 6 months. Finasteride refilled. - 06/06/2020, He is voiding ok on finasteride. , - 2019 (Stable), - 2019 (Stable), He is voiding well but has been off of the finasteride for a few weeks. His PSA is up but his exam is unremarkable. I have refilled the finasteride and will have him return for  a PSA in 3 months and if it is still rising, I will consider a biopsy. If it is down, I will have him return in 6 months with a PSA. , - 2018, Benign prostatic hyperplasia with urinary obstruction, - 2016 Elevated PSA - 06/06/2020, - 2017, Elevated prostate specific antigen (PSA), - 2016 Nocturia - 06/06/2020, Nocturia, - 2016 Prostate nodule w/ LUTS, His PSA has increased to 12 on finasteride and his prostate is asymmetric with firmness on the right. I discussed the pros and cons of a biopsy and will get him set up for that. I gave him a script for levaquin. If he has cancer, he will be a poor candidate for local therapy and will need ADT as primary therapy if that becomes necessary. - 2020 Benign Neo Lft adrenal gland (Stable), His adrenal mass is stable since 2006. -  2019 Gross hematuria, Gross hematuria - 2016 Acute Cystitis/UTI, Acute Cystitis - 2014 Bladder-neck stenosis/contracture, Bladder Neck Obstruction - 2014 BPH w/o LUTS, Benign Prostatic Hypertrophy - 2014 Urinary Retention, Unspec, Acute Urinary Retention - 2014 Urinary Tract Inf, Unspec site, Pyuria - 2014      PMH Notes: CLL   Dementia   NON-GU PMH: Encounter for general adult medical examination without abnormal findings, Encounter for preventive health examination - 2015 Chronic lymphocytic leukemia of B-cell type in remission Neoplasm of unspecified behavior of bone, soft tissue, and skin Skin Cancer, History    FAMILY HISTORY: Family Health Status Number - Runs In Family No pertinent family history - Other   SOCIAL HISTORY: Marital Status: Married Preferred Language: English; Ethnicity: Not Hispanic Or Latino; Race: Black or African American Current Smoking Status: Patient does not smoke anymore. Has not smoked since 05/21/2018.   Tobacco Use Assessment Completed: Used Tobacco in last 30 days? Does not drink anymore.  Does not drink caffeine. Patient's occupation is/was Retired.     Notes: Current every day smoker, Occupation:, Death In The Family Mother, Tobacco Use, Marital History - Currently Married, Death In The Family Father   REVIEW OF SYSTEMS:    GU Review Male:   Patient reports get up at night to urinate and leakage of urine. Patient denies frequent urination, hard to postpone urination, burning/ pain with urination, stream starts and stops, trouble starting your stream, have to strain to urinate , erection problems, and penile pain.  Gastrointestinal (Upper):   Patient denies nausea, vomiting, and indigestion/ heartburn.  Gastrointestinal (Lower):   Patient denies diarrhea and constipation.  Constitutional:   Patient denies fever, night sweats, weight loss, and fatigue.  Skin:   Patient denies skin rash/ lesion and itching.  Eyes:   Patient denies blurred vision  and double vision.  Ears/ Nose/ Throat:   Patient denies sore throat and sinus problems.  Hematologic/Lymphatic:   Patient denies swollen glands and easy bruising.  Cardiovascular:   Patient denies leg swelling and chest pains.  Respiratory:   Patient denies cough and shortness of breath.  Endocrine:   Patient denies excessive thirst.  Musculoskeletal:   Patient denies back pain and joint pain.  Neurological:   Patient denies headaches and dizziness.  Psychologic:   Patient denies anxiety and depression.   VITAL SIGNS: None   GU PHYSICAL EXAMINATION:    Anus and Perineum: No hemorrhoids. No anal stenosis. No rectal fissure, no anal fissure. No edema, no dimple, no perineal tenderness, no anal tenderness.  Prostate: Prostate 2 + size. Left base 8 mm prostate nodule. Left lobe normal consistency, right lobe  normal consistency. Symmetrical lobes. Left lobe no tenderness, right lobe no tenderness.   Seminal Vesicles: Nonpalpable.  Sphincter Tone: Normal sphincter. No rectal tenderness. No rectal mass.    MULTI-SYSTEM PHYSICAL EXAMINATION:    Constitutional: Well-nourished. No physical deformities. Normally developed. Good grooming.  Lymphatic: No enlargement, no tenderness of axillae, supraclavicular, neck lymph nodes.     Complexity of Data:  Lab Test Review:   PSA, CBC with Diff  Records Review:   Previous Patient Records  Urine Test Review:   Urinalysis   12/01/20 06/06/20 01/23/18 10/30/17 05/01/17 02/04/17 10/29/16 03/28/16  PSA  Total PSA 36.24 ng/ml 12.20 ng/mL 8.2 ng/dl 6.9 ng/dl 6.62 ng/mL 5.17 ng/mL 7.40 ng/mL 4.07 ng/dl  Free PSA     0.70 ng/mL 0.56 ng/mL 1.07 ng/mL   % Free PSA   10 %  11 % PSA 11 % PSA 14 % PSA    Notes:                     Recent WBC is 32 and platelet count was 144K in 9/21 and 111k on 11/10/20 .   PROCEDURES:          Urinalysis Dipstick Dipstick Cont'd  Color: Yellow Bilirubin: Neg mg/dL  Appearance: Clear Ketones: Neg mg/dL  Specific Gravity:  1.025 Blood: Neg ery/uL  pH: <=5.0 Protein: Neg mg/dL  Glucose: Neg mg/dL Urobilinogen: 0.2 mg/dL    Nitrites: Neg    Leukocyte Esterase: Neg leu/uL    ASSESSMENT:      ICD-10 Details  1 GU:   Prostate nodule w/ LUTS - N40.3 Chronic, Worsening - His PSA has tripled on finasteride to 36 and he has a left base nodule. I will get him set up for a prostate [place] and biopsy and reviewed the risks of bleeding, infection and voiding difficulty. With his dementia, I will do it in the OR.   2   Elevated PSA - R97.20 Chronic, Worsening - He has a mild thrombocytopenia so I will get clearance from his Oncologist.   3   Nocturia - R35.1 Chronic, Stable   PLAN:            Medications Stop Meds: Iron  Discontinue: 12/14/2020  - Reason: The medication cycle was completed.            Schedule Return Visit/Planned Activity: Next Available Appointment - Schedule Surgery  Procedure: Unspecified Date - Prostate Needle Biopsy - 55700 Notes: Next avail

## 2020-12-30 NOTE — Transfer of Care (Signed)
Immediate Anesthesia Transfer of Care Note  Patient: Adrian Valentine  Procedure(s) Performed: BIOPSY TRANSRECTAL ULTRASONIC PROSTATE (TUBP) (Prostate)  Patient Location: PACU  Anesthesia Type:MAC  Level of Consciousness: drowsy, patient cooperative and responds to stimulation  Airway & Oxygen Therapy: Patient Spontanous Breathing and Patient connected to face mask oxygen  Post-op Assessment: Report given to RN and Post -op Vital signs reviewed and stable  Post vital signs: Reviewed and stable  Last Vitals:  Vitals Value Taken Time  BP 103/65 12/30/20 1324  Temp    Pulse 74 12/30/20 1326  Resp    SpO2 97 % 12/30/20 1326  Vitals shown include unvalidated device data.  Last Pain:  Vitals:   12/30/20 1110  TempSrc: Oral         Complications: No notable events documented.

## 2021-01-02 ENCOUNTER — Encounter (HOSPITAL_BASED_OUTPATIENT_CLINIC_OR_DEPARTMENT_OTHER): Payer: Self-pay | Admitting: Urology

## 2021-01-02 ENCOUNTER — Other Ambulatory Visit: Payer: Self-pay | Admitting: Urology

## 2021-01-02 ENCOUNTER — Other Ambulatory Visit (HOSPITAL_COMMUNITY): Payer: Self-pay | Admitting: Urology

## 2021-01-02 DIAGNOSIS — C61 Malignant neoplasm of prostate: Secondary | ICD-10-CM

## 2021-01-02 LAB — SURGICAL PATHOLOGY

## 2021-01-02 NOTE — Anesthesia Postprocedure Evaluation (Signed)
Anesthesia Post Note  Patient: Adrian Valentine  Procedure(s) Performed: BIOPSY TRANSRECTAL ULTRASONIC PROSTATE (TUBP) (Prostate)     Patient location during evaluation: PACU Anesthesia Type: MAC Level of consciousness: awake and alert Pain management: pain level controlled Vital Signs Assessment: post-procedure vital signs reviewed and stable Respiratory status: spontaneous breathing, nonlabored ventilation, respiratory function stable and patient connected to nasal cannula oxygen Cardiovascular status: stable and blood pressure returned to baseline Postop Assessment: no apparent nausea or vomiting Anesthetic complications: no   No notable events documented.  Last Vitals:  Vitals:   12/30/20 1400 12/30/20 1430  BP: 122/62 107/67  Pulse: 77 65  Resp: 14 14  Temp:  36.5 C  SpO2: 99% 100%    Last Pain:  Vitals:   12/30/20 1430  TempSrc:   PainSc: 0-No pain                 Zhane Donlan S

## 2021-01-10 ENCOUNTER — Other Ambulatory Visit: Payer: Self-pay

## 2021-01-10 ENCOUNTER — Encounter (HOSPITAL_COMMUNITY)
Admission: RE | Admit: 2021-01-10 | Discharge: 2021-01-10 | Disposition: A | Discharge status: Home / Self Care | Payer: Medicare Other | Source: Ambulatory Visit | Attending: Urology | Admitting: Urology

## 2021-01-10 DIAGNOSIS — C61 Malignant neoplasm of prostate: Secondary | ICD-10-CM | POA: Diagnosis present

## 2021-01-10 MED ORDER — TECHNETIUM TC 99M MEDRONATE IV KIT
20.0000 | PACK | Freq: Once | INTRAVENOUS | Status: AC | PRN
  Administered 2021-01-10: 19.5 via INTRAVENOUS

## 2021-06-01 ENCOUNTER — Other Ambulatory Visit: Payer: Self-pay | Admitting: Urology

## 2022-05-01 ENCOUNTER — Other Ambulatory Visit: Payer: Self-pay | Admitting: Urology

## 2022-06-20 DEATH — deceased

## 2022-07-19 IMAGING — NM NM BONE WHOLE BODY
2 series · 2 of 2 positions shown · non-contrast
Comparison: 01/03/2021

CLINICAL DATA: Prostate cancer

EXAM:
NUCLEAR MEDICINE WHOLE BODY BONE SCAN
TECHNIQUE: Whole body anterior and posterior images were obtained approximately
3 hours after intravenous injection of radiopharmaceutical.
RADIOPHARMACEUTICALS:  19.5 mCi Eechnetium-OOm MDP IV

[Series 1: [email or web address] whole body · 2.66mm/px · 1 of 1 slices shown (1 of 2)]
[im 1/1]
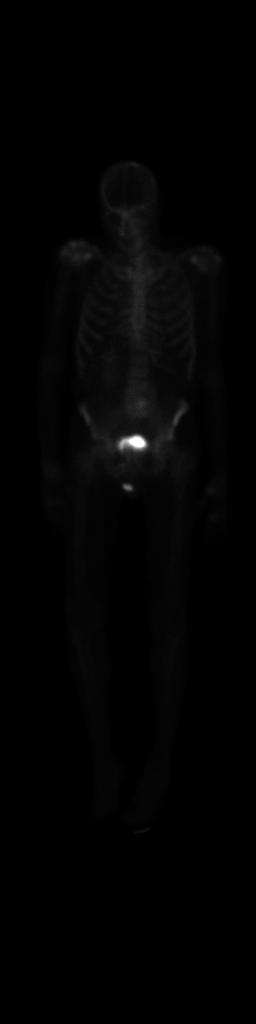

[Series 1: [email or web address] whole body · 2.66mm/px · 1 of 1 slices shown (2 of 2)]
[im 1/1]
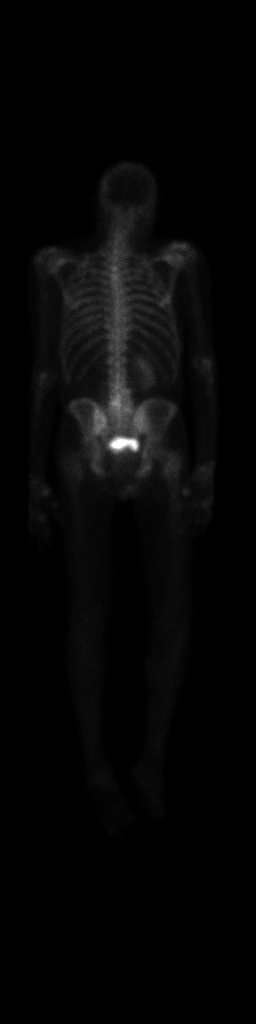

[2 of 2 positions shown; findings below may reference images not displayed]

FINDINGS: Anterior and posterior whole body planar images demonstrate
physiologic excretion of radiotracer within the kidneys and bladder.
The left kidney is displaced inferiorly by marked splenomegaly as
seen on recent CT.

Degenerative type activity is seen at the bilateral shoulders,
bilateral hands and wrists, and lower lumbar spine.

Small area of indeterminate radiotracer uptake along the left
parietal convexity of the calvarium. No other abnormal radiotracer
uptake to suggest bony metastases.
IMPRESSION: 1. Indeterminate solitary focus of radiotracer uptake along the left
parietal calvarium. Metastatic disease cannot be excluded.
2. No other evidence of bony metastases.
3. Extensive degenerative changes of the bilateral shoulders,
wrists/hands, and lower lumbar spine.
